# Patient Record
Sex: Female | Born: 1965 | Race: White | Hispanic: No | Marital: Single | Smoking: Current every day smoker
Health system: Southern US, Community
[De-identification: ages and names within clinical notes are randomized; demographics above are authoritative.]

## PROBLEM LIST (undated history)

## (undated) DIAGNOSIS — F419 Anxiety disorder, unspecified: Secondary | ICD-10-CM

## (undated) DIAGNOSIS — M1990 Unspecified osteoarthritis, unspecified site: Secondary | ICD-10-CM

## (undated) DIAGNOSIS — K219 Gastro-esophageal reflux disease without esophagitis: Secondary | ICD-10-CM

## (undated) DIAGNOSIS — J449 Chronic obstructive pulmonary disease, unspecified: Secondary | ICD-10-CM

## (undated) DIAGNOSIS — F329 Major depressive disorder, single episode, unspecified: Secondary | ICD-10-CM

## (undated) DIAGNOSIS — S2249XA Multiple fractures of ribs, unspecified side, initial encounter for closed fracture: Secondary | ICD-10-CM

## (undated) DIAGNOSIS — F32A Depression, unspecified: Secondary | ICD-10-CM

---

## 1996-11-26 HISTORY — PX: SPLENECTOMY: SUR1306

## 2005-01-11 ENCOUNTER — Emergency Department: Payer: Self-pay | Admitting: Emergency Medicine

## 2005-03-20 ENCOUNTER — Emergency Department: Payer: Self-pay | Admitting: Emergency Medicine

## 2005-05-15 ENCOUNTER — Emergency Department: Payer: Self-pay | Admitting: Internal Medicine

## 2006-09-20 ENCOUNTER — Emergency Department: Payer: Self-pay | Admitting: Emergency Medicine

## 2007-05-18 ENCOUNTER — Emergency Department: Payer: Self-pay

## 2007-06-01 ENCOUNTER — Emergency Department: Payer: Self-pay | Admitting: Emergency Medicine

## 2007-06-05 ENCOUNTER — Emergency Department: Payer: Self-pay | Admitting: Emergency Medicine

## 2007-06-30 ENCOUNTER — Emergency Department: Payer: Self-pay | Admitting: Emergency Medicine

## 2007-07-31 ENCOUNTER — Emergency Department: Payer: Self-pay | Admitting: Emergency Medicine

## 2007-09-29 ENCOUNTER — Ambulatory Visit: Payer: Self-pay | Admitting: Unknown Physician Specialty

## 2007-10-06 ENCOUNTER — Encounter: Payer: Self-pay | Admitting: Unknown Physician Specialty

## 2007-10-27 ENCOUNTER — Encounter: Payer: Self-pay | Admitting: Unknown Physician Specialty

## 2007-11-27 ENCOUNTER — Encounter: Payer: Self-pay | Admitting: Unknown Physician Specialty

## 2008-03-20 ENCOUNTER — Emergency Department: Payer: Self-pay | Admitting: Emergency Medicine

## 2008-07-08 ENCOUNTER — Other Ambulatory Visit: Payer: Self-pay

## 2008-07-08 ENCOUNTER — Inpatient Hospital Stay: Payer: Self-pay | Admitting: Psychiatry

## 2008-07-14 ENCOUNTER — Other Ambulatory Visit: Payer: Self-pay

## 2008-07-14 ENCOUNTER — Emergency Department: Payer: Self-pay | Admitting: Emergency Medicine

## 2008-11-26 HISTORY — PX: FRACTURE SURGERY: SHX138

## 2009-04-20 ENCOUNTER — Emergency Department: Payer: Self-pay | Admitting: Emergency Medicine

## 2009-06-09 ENCOUNTER — Ambulatory Visit: Payer: Self-pay | Admitting: Surgery

## 2009-06-10 ENCOUNTER — Ambulatory Visit: Payer: Self-pay | Admitting: Surgery

## 2009-06-14 ENCOUNTER — Emergency Department: Payer: Self-pay | Admitting: Emergency Medicine

## 2009-11-26 HISTORY — PX: HERNIA REPAIR: SHX51

## 2010-01-01 ENCOUNTER — Emergency Department: Payer: Self-pay | Admitting: Emergency Medicine

## 2010-01-06 ENCOUNTER — Ambulatory Visit: Payer: Self-pay | Admitting: General Practice

## 2010-01-09 ENCOUNTER — Ambulatory Visit: Payer: Self-pay | Admitting: General Practice

## 2010-10-01 ENCOUNTER — Emergency Department: Payer: Self-pay | Admitting: Unknown Physician Specialty

## 2010-10-06 ENCOUNTER — Ambulatory Visit: Payer: Self-pay | Admitting: Orthopedic Surgery

## 2011-01-15 ENCOUNTER — Other Ambulatory Visit (HOSPITAL_COMMUNITY): Payer: Medicaid Other

## 2011-01-17 ENCOUNTER — Other Ambulatory Visit (HOSPITAL_COMMUNITY): Payer: Medicaid Other

## 2011-01-18 ENCOUNTER — Encounter (HOSPITAL_COMMUNITY)
Admission: RE | Admit: 2011-01-18 | Discharge: 2011-01-18 | Disposition: A | Discharge status: Home / Self Care | Payer: Medicaid Other | Source: Ambulatory Visit | Attending: Orthopedic Surgery | Admitting: Orthopedic Surgery

## 2011-01-18 DIAGNOSIS — Z01812 Encounter for preprocedural laboratory examination: Secondary | ICD-10-CM | POA: Insufficient documentation

## 2011-01-18 DIAGNOSIS — Z01811 Encounter for preprocedural respiratory examination: Secondary | ICD-10-CM | POA: Insufficient documentation

## 2011-01-18 LAB — BASIC METABOLIC PANEL
Calcium: 9.9 mg/dL (ref 8.4–10.5)
GFR calc Af Amer: >60 mL/min (ref >60)
GFR calc non Af Amer: >60 mL/min (ref >60)
Potassium: 4.6 mEq/L (ref 3.5–5.1)
Sodium: 139 mEq/L (ref 135–145)

## 2011-01-18 LAB — DIFFERENTIAL
Basophils Absolute: 0.1 10*3/uL (ref 0.0–0.1)
Basophils Relative: 1 % (ref 0–1)
Lymphocytes Relative: 50 % — ABNORMAL HIGH (ref 12–46)
Monocytes Relative: 14 % — ABNORMAL HIGH (ref 3–12)
Neutro Abs: 3 10*3/uL (ref 1.7–7.7)
Neutrophils Relative %: 33 % — ABNORMAL LOW (ref 43–77)

## 2011-01-18 LAB — URINE MICROSCOPIC-ADD ON

## 2011-01-18 LAB — PROTIME-INR
INR: 0.8 (ref 0.00–1.49)
Prothrombin Time: 11.3 seconds — ABNORMAL LOW (ref 11.6–15.2)

## 2011-01-18 LAB — URINALYSIS, ROUTINE W REFLEX MICROSCOPIC
Nitrite: POSITIVE — AB
Specific Gravity, Urine: 1.016 (ref 1.005–1.030)
Urobilinogen, UA: 1 mg/dL (ref 0.0–1.0)

## 2011-01-18 LAB — CBC
HCT: 42.2 % (ref 36.0–46.0)
Hemoglobin: 14.5 g/dL (ref 12.0–15.0)
RBC: 4.21 MIL/uL (ref 3.87–5.11)

## 2011-01-18 LAB — SURGICAL PCR SCREEN: MRSA, PCR: NEGATIVE

## 2011-01-19 ENCOUNTER — Ambulatory Visit (HOSPITAL_COMMUNITY)
Admission: RE | Admit: 2011-01-19 | Discharge: 2011-01-20 | Disposition: A | Discharge status: Home / Self Care | Payer: Medicaid Other | Source: Ambulatory Visit | Attending: Orthopedic Surgery | Admitting: Orthopedic Surgery

## 2011-01-19 DIAGNOSIS — S42033A Displaced fracture of lateral end of unspecified clavicle, initial encounter for closed fracture: Secondary | ICD-10-CM | POA: Insufficient documentation

## 2011-01-19 DIAGNOSIS — F17200 Nicotine dependence, unspecified, uncomplicated: Secondary | ICD-10-CM | POA: Insufficient documentation

## 2011-01-20 LAB — CBC
HCT: 37.7 % (ref 36.0–46.0)
Hemoglobin: 12.5 g/dL (ref 12.0–15.0)
MCHC: 33.2 g/dL (ref 30.0–36.0)
MCV: 101.9 fL — ABNORMAL HIGH (ref 78.0–100.0)

## 2011-01-20 LAB — BASIC METABOLIC PANEL
CO2: 29 mEq/L (ref 19–32)
Calcium: 8.5 mg/dL (ref 8.4–10.5)
Glucose, Bld: 111 mg/dL — ABNORMAL HIGH (ref 70–99)
Sodium: 139 mEq/L (ref 135–145)
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
## 2011-02-06 ENCOUNTER — Inpatient Hospital Stay (HOSPITAL_COMMUNITY): Admission: RE | Admit: 2011-02-06 | Payer: Medicaid Other | Source: Ambulatory Visit | Admitting: Neurosurgery
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
## 2011-06-19 ENCOUNTER — Ambulatory Visit: Payer: Self-pay | Admitting: Internal Medicine

## 2011-11-06 NOTE — H&P (Signed)
45 y/o female with a previous coracoclaviclar reconstruction that failed. Pt presents with continued and worsening right shoulder pain interested in surgery to decrease her pain and increase rom of the right shoulder PMH: COPD, substance abuse Surgical: right shoulder CC reconstruction Allergies: NKDA Meds: spiriva, reclast, norco, calcium Social: smokes, positive for cocaine, positive for ETOH, Dr. Lacie Scotts for primary care PE: alert and appropriate 45 y/o female in no acute distress Cervical spine: full rom, cranial nerves 2-12 intact,  Right shoulder: moderate pain to [facility] area with mild deformity, nv intact distally, moderately decreased rom secondary to pain, strength limited to right shoulder as compared to left, no rashes or edema Assessment: failed coracoclavicular reconstruction Plan: admission for repair of right shoulder with allograft tissue and hardware removal

## 2011-11-07 ENCOUNTER — Encounter (HOSPITAL_COMMUNITY): Payer: Self-pay

## 2011-11-16 ENCOUNTER — Inpatient Hospital Stay (HOSPITAL_COMMUNITY): Admission: RE | Admit: 2011-11-16 | Discharge: 2011-11-16 | Payer: Medicaid Other | Source: Ambulatory Visit

## 2011-11-16 NOTE — Pre-Procedure Instructions (Signed)
20 Latoya [name]  11/16/2011   Your procedure is scheduled on: Friday 11/23/11   Report to [facility] at 1030 AM.  Call this number if you have problems the morning of surgery: [phone]   Remember:   Do not eat food:After Midnight.  May have clear liquids: up to 4 Hours before arrival.  Clear liquids include soda, tea, black coffee, apple or grape juice, broth.  Take these medicines the morning of surgery with A SIP OF WATER: INHALERS, XANAX, CYMBALTA, NEXIUM,    Do not wear jewelry, make-up or nail polish.  Do not wear lotions, powders, or perfumes. You may wear deodorant.  Do not shave 48 hours prior to surgery.  Do not bring valuables to the hospital.  Contacts, dentures or bridgework may not be worn into surgery.  Leave suitcase in the car. After surgery it may be brought to your room.  For patients admitted to the hospital, checkout time is 11:00 AM the day of discharge.   Patients discharged the day of surgery will not be allowed to drive home.  Name and phone number of your driver:   Special Instructions: CHG Shower Use Special Wash: 1/2 bottle night before surgery and 1/2 bottle morning of surgery.   Please read over the following fact sheets that you were given: Pain Booklet, MRSA Information and Surgical Site Infection Prevention

## 2011-11-19 ENCOUNTER — Inpatient Hospital Stay (HOSPITAL_COMMUNITY): Admission: RE | Admit: 2011-11-19 | Payer: Medicaid Other | Source: Ambulatory Visit

## 2011-11-22 ENCOUNTER — Inpatient Hospital Stay (HOSPITAL_COMMUNITY): Admission: RE | Admit: 2011-11-22 | Payer: Medicaid Other | Source: Ambulatory Visit

## 2011-11-27 ENCOUNTER — Inpatient Hospital Stay: Payer: Self-pay | Admitting: Surgery

## 2011-11-27 DIAGNOSIS — S2249XA Multiple fractures of ribs, unspecified side, initial encounter for closed fracture: Secondary | ICD-10-CM

## 2011-11-27 HISTORY — DX: Multiple fractures of ribs, unspecified side, initial encounter for closed fracture: S22.49XA

## 2011-11-27 LAB — COMPREHENSIVE METABOLIC PANEL WITH GFR
Albumin: 3.9 g/dL
Alkaline Phosphatase: 84 U/L
Anion Gap: 12
BUN: 6 mg/dL — ABNORMAL LOW
Bilirubin,Total: 0.4 mg/dL
Calcium, Total: 8.2 mg/dL — ABNORMAL LOW
Chloride: 95 mmol/L — ABNORMAL LOW
Co2: 28 mmol/L
Creatinine: 0.59 mg/dL — ABNORMAL LOW
EGFR (African American): >60
EGFR (Non-African Amer.): >60
Glucose: 90 mg/dL
Osmolality: 267
Potassium: 3.5 mmol/L
SGOT(AST): 75 U/L — ABNORMAL HIGH
SGPT (ALT): 47 U/L
Sodium: 135 mmol/L — ABNORMAL LOW
Total Protein: 7.3 g/dL

## 2011-11-27 LAB — CBC
HCT: 42.4 %
HGB: 14.1 g/dL
MCH: 34.2 pg — ABNORMAL HIGH
MCHC: 33.2 g/dL
MCV: 103 fL — ABNORMAL HIGH
Platelet: 302 x10 3/mm 3
RBC: 4.11 X10 6/mm 3
RDW: 12.4 %
WBC: 9.4 x10 3/mm 3

## 2011-11-27 LAB — URINALYSIS, COMPLETE
Bilirubin,UR: NEGATIVE
Glucose,UR: NEGATIVE mg/dL
Ketone: NEGATIVE
Nitrite: POSITIVE
Ph: 6
Protein: NEGATIVE
RBC,UR: 11 /HPF
Specific Gravity: 1.004
Squamous Epithelial: 1
WBC UR: 30 /HPF

## 2011-11-27 LAB — ETHANOL
Ethanol %: 0.241 % — ABNORMAL HIGH (ref 0.000–0.080)
Ethanol: 241 mg/dL

## 2011-11-28 LAB — COMPREHENSIVE METABOLIC PANEL
Albumin: 3.4 g/dL (ref 3.4–5.0)
BUN: 4 mg/dL — ABNORMAL LOW (ref 7–18)
Creatinine: 0.54 mg/dL — ABNORMAL LOW (ref 0.60–1.30)
Glucose: 108 mg/dL — ABNORMAL HIGH (ref 65–99)
Potassium: 4.4 mmol/L (ref 3.5–5.1)
Sodium: 139 mmol/L (ref 136–145)
Total Protein: 6.9 g/dL (ref 6.4–8.2)

## 2011-11-28 LAB — CBC WITH DIFFERENTIAL/PLATELET
HGB: 13.7 g/dL (ref 12.0–16.0)
MCH: 34.2 pg — ABNORMAL HIGH (ref 26.0–34.0)
MCV: 104 fL — ABNORMAL HIGH (ref 80–100)
Monocyte %: 13.8 %
Neutrophil #: 4.1 10*3/uL (ref 1.4–6.5)
RBC: 4 10*6/uL (ref 3.80–5.20)
WBC: 8.6 10*3/uL (ref 3.6–11.0)

## 2011-11-28 LAB — PROTIME-INR: Prothrombin Time: 12.2 secs (ref 11.5–14.7)

## 2011-11-28 LAB — AMYLASE: Amylase: 15 U/L — ABNORMAL LOW (ref 25–115)

## 2011-12-11 ENCOUNTER — Ambulatory Visit: Payer: Self-pay | Admitting: Surgery

## 2011-12-11 LAB — PREGNANCY, URINE: Pregnancy Test, Urine: NEGATIVE m[IU]/mL

## 2011-12-20 ENCOUNTER — Encounter (HOSPITAL_COMMUNITY): Admission: RE | Admit: 2011-12-20 | Payer: Medicaid Other | Source: Ambulatory Visit

## 2011-12-20 ENCOUNTER — Encounter (HOSPITAL_COMMUNITY): Payer: Self-pay

## 2011-12-20 ENCOUNTER — Encounter (HOSPITAL_COMMUNITY)
Admission: RE | Admit: 2011-12-20 | Discharge: 2011-12-20 | Disposition: A | Discharge status: Home / Self Care | Payer: Medicaid Other | Source: Ambulatory Visit | Attending: Orthopedic Surgery | Admitting: Orthopedic Surgery

## 2011-12-20 HISTORY — DX: Gastro-esophageal reflux disease without esophagitis: K21.9

## 2011-12-20 HISTORY — DX: Multiple fractures of ribs, unspecified side, initial encounter for closed fracture: S22.49XA

## 2011-12-20 HISTORY — DX: Depression, unspecified: F32.A

## 2011-12-20 HISTORY — DX: Unspecified osteoarthritis, unspecified site: M19.90

## 2011-12-20 HISTORY — DX: Anxiety disorder, unspecified: F41.9

## 2011-12-20 HISTORY — DX: Chronic obstructive pulmonary disease, unspecified: J44.9

## 2011-12-20 HISTORY — DX: Major depressive disorder, single episode, unspecified: F32.9

## 2011-12-20 LAB — CBC
HCT: 46.5 % — ABNORMAL HIGH (ref 36.0–46.0)
Hemoglobin: 15.4 g/dL — ABNORMAL HIGH (ref 12.0–15.0)
MCHC: 33.1 g/dL (ref 30.0–36.0)
RBC: 4.63 MIL/uL (ref 3.87–5.11)

## 2011-12-20 LAB — SURGICAL PCR SCREEN
MRSA, PCR: NEGATIVE
Staphylococcus aureus: NEGATIVE

## 2011-12-20 NOTE — Pre-Procedure Instructions (Signed)
20 Latoya [name]  12/20/2011   Your procedure is scheduled on:  Fri, Feb 1  Report to [facility] at 0530 AM.  Call this number if you have problems the morning of surgery: [phone]   Remember:   Do not eat food:After Midnight.  May have clear liquids: up to 4 Hours before arrival.  Clear liquids include soda, tea, black coffee, apple or grape juice, broth.  Take these medicines the morning of surgery with A SIP OF WATER: *inhalers, Spiriva,Nexium, Xanax,oxycodone**   Do not wear jewelry, make-up or nail polish.  Do not wear lotions, powders, or perfumes. You may wear deodorant.  Do not shave 48 hours prior to surgery.  Do not bring valuables to the hospital.  Contacts, dentures or bridgework may not be worn into surgery.  Leave suitcase in the car. After surgery it may be brought to your room.  For patients admitted to the hospital, checkout time is 11:00 AM the day of discharge.   Patients discharged the day of surgery will not be allowed to drive home.  Name and phone number of your driver: **na/*  Special Instructions: Incentive Spirometry - Practice and bring it with you on the day of surgery. CHG Shower the night before and the morning of surgery.   Please read over the following fact sheets that you were given: Pain Booklet, Coughing and Deep Breathing, MRSA Information and Surgical Site Infection Prevention
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
## 2011-12-27 MED ORDER — CEFAZOLIN SODIUM 1-5 GM-% IV SOLN
1.0000 g | INTRAVENOUS | Status: AC
  Administered 2011-12-28: 1 g via INTRAVENOUS

## 2011-12-27 MED ORDER — CHLORHEXIDINE GLUCONATE 4 % EX LIQD: 60.0000 mL | Freq: Once | CUTANEOUS | Status: DC

## 2011-12-27 MED ORDER — SODIUM CHLORIDE 0.9 % IV SOLN: INTRAVENOUS | Status: DC

## 2011-12-28 ENCOUNTER — Encounter (HOSPITAL_COMMUNITY)
Admission: RE | Disposition: A | Discharge status: Home / Self Care | Payer: Self-pay | Source: Ambulatory Visit | Attending: Orthopedic Surgery

## 2011-12-28 ENCOUNTER — Encounter (HOSPITAL_COMMUNITY): Payer: Self-pay | Admitting: *Deleted

## 2011-12-28 ENCOUNTER — Ambulatory Visit (HOSPITAL_COMMUNITY)
Admission: RE | Admit: 2011-12-28 | Discharge: 2011-12-29 | Disposition: A | Discharge status: Home / Self Care | Payer: Medicaid Other | Source: Ambulatory Visit | Attending: Orthopedic Surgery | Admitting: Orthopedic Surgery

## 2011-12-28 ENCOUNTER — Ambulatory Visit (HOSPITAL_COMMUNITY): Payer: Medicaid Other | Admitting: Vascular Surgery

## 2011-12-28 ENCOUNTER — Other Ambulatory Visit: Payer: Self-pay

## 2011-12-28 ENCOUNTER — Encounter (HOSPITAL_COMMUNITY): Payer: Self-pay | Admitting: Vascular Surgery

## 2011-12-28 DIAGNOSIS — Z01812 Encounter for preprocedural laboratory examination: Secondary | ICD-10-CM | POA: Insufficient documentation

## 2011-12-28 DIAGNOSIS — J4489 Other specified chronic obstructive pulmonary disease: Secondary | ICD-10-CM | POA: Insufficient documentation

## 2011-12-28 DIAGNOSIS — F17200 Nicotine dependence, unspecified, uncomplicated: Secondary | ICD-10-CM | POA: Insufficient documentation

## 2011-12-28 DIAGNOSIS — M25519 Pain in unspecified shoulder: Secondary | ICD-10-CM | POA: Diagnosis present

## 2011-12-28 DIAGNOSIS — Y831 Surgical operation with implant of artificial internal device as the cause of abnormal reaction of the patient, or of later complication, without mention of misadventure at the time of the procedure: Secondary | ICD-10-CM | POA: Insufficient documentation

## 2011-12-28 DIAGNOSIS — J449 Chronic obstructive pulmonary disease, unspecified: Secondary | ICD-10-CM | POA: Insufficient documentation

## 2011-12-28 DIAGNOSIS — T84498A Other mechanical complication of other internal orthopedic devices, implants and grafts, initial encounter: Secondary | ICD-10-CM | POA: Insufficient documentation

## 2011-12-28 DIAGNOSIS — K08109 Complete loss of teeth, unspecified cause, unspecified class: Secondary | ICD-10-CM | POA: Insufficient documentation

## 2011-12-28 HISTORY — PX: HARDWARE REMOVAL: SHX979

## 2011-12-28 SURGERY — REMOVAL, HARDWARE
Anesthesia: General | Site: Shoulder | Laterality: Right | Wound class: Clean

## 2011-12-28 MED ORDER — CEFAZOLIN SODIUM 1-5 GM-% IV SOLN
INTRAVENOUS | Status: AC
  Filled 2011-12-28: qty 50

## 2011-12-28 MED ORDER — BECLOMETHASONE DIPROPIONATE 80 MCG/ACT IN AERS
2.0000 | INHALATION_SPRAY | Freq: Every day | RESPIRATORY_TRACT | Status: DC
  Administered 2011-12-28 – 2011-12-29 (×2): 2 via RESPIRATORY_TRACT
  Filled 2011-12-28: qty 8.7

## 2011-12-28 MED ORDER — LACTATED RINGERS IV SOLN
INTRAVENOUS | Status: DC | PRN
  Administered 2011-12-28 (×2): via INTRAVENOUS

## 2011-12-28 MED ORDER — ACETAMINOPHEN 325 MG PO TABS
ORAL_TABLET | ORAL | Status: AC
  Filled 2011-12-28: qty 2

## 2011-12-28 MED ORDER — MIDAZOLAM HCL 2 MG/2ML IJ SOLN
1.0000 mg | INTRAMUSCULAR | Status: DC | PRN
  Administered 2011-12-28: 2 mg via INTRAVENOUS

## 2011-12-28 MED ORDER — HYDROMORPHONE HCL PF 1 MG/ML IJ SOLN
0.5000 mg | INTRAMUSCULAR | Status: DC | PRN
  Administered 2011-12-28 – 2011-12-29 (×5): 1 mg via INTRAVENOUS
  Filled 2011-12-28 (×6): qty 1

## 2011-12-28 MED ORDER — RISEDRONATE SODIUM 35 MG PO TBEC: 1.0000 | DELAYED_RELEASE_TABLET | ORAL | Status: DC

## 2011-12-28 MED ORDER — LACTATED RINGERS IV SOLN
INTRAVENOUS | Status: DC
  Administered 2011-12-28: 10:00:00 via INTRAVENOUS

## 2011-12-28 MED ORDER — METOCLOPRAMIDE HCL 10 MG PO TABS: 5.0000 mg | ORAL_TABLET | Freq: Three times a day (TID) | ORAL | Status: DC | PRN

## 2011-12-28 MED ORDER — HYDROMORPHONE HCL PF 1 MG/ML IJ SOLN
0.2500 mg | INTRAMUSCULAR | Status: DC | PRN
  Administered 2011-12-28: 0.25 mg via INTRAVENOUS

## 2011-12-28 MED ORDER — ONDANSETRON HCL 4 MG/2ML IJ SOLN: 4.0000 mg | Freq: Once | INTRAMUSCULAR | Status: DC | PRN

## 2011-12-28 MED ORDER — ONDANSETRON HCL 4 MG PO TABS: 4.0000 mg | ORAL_TABLET | Freq: Four times a day (QID) | ORAL | Status: DC | PRN

## 2011-12-28 MED ORDER — ARIPIPRAZOLE 5 MG PO TABS
5.0000 mg | ORAL_TABLET | Freq: Every day | ORAL | Status: DC
  Filled 2011-12-28 (×2): qty 1

## 2011-12-28 MED ORDER — PROPOFOL 10 MG/ML IV EMUL
INTRAVENOUS | Status: DC | PRN
  Administered 2011-12-28: 200 mg via INTRAVENOUS

## 2011-12-28 MED ORDER — ONDANSETRON HCL 4 MG/2ML IJ SOLN
INTRAMUSCULAR | Status: DC | PRN
  Administered 2011-12-28: 4 mg via INTRAVENOUS

## 2011-12-28 MED ORDER — HYDROMORPHONE HCL PF 1 MG/ML IJ SOLN
INTRAMUSCULAR | Status: AC
  Filled 2011-12-28: qty 1

## 2011-12-28 MED ORDER — FENTANYL CITRATE 0.05 MG/ML IJ SOLN
50.0000 ug | INTRAMUSCULAR | Status: DC | PRN
  Administered 2011-12-28: 100 ug via INTRAVENOUS

## 2011-12-28 MED ORDER — NEOSTIGMINE METHYLSULFATE 1 MG/ML IJ SOLN
INTRAMUSCULAR | Status: DC | PRN
  Administered 2011-12-28: 3 mg via INTRAVENOUS

## 2011-12-28 MED ORDER — ACETAMINOPHEN 325 MG PO TABS
650.0000 mg | ORAL_TABLET | Freq: Four times a day (QID) | ORAL | Status: DC | PRN
  Administered 2011-12-28: 650 mg via ORAL

## 2011-12-28 MED ORDER — MORPHINE SULFATE 2 MG/ML IJ SOLN: 0.0500 mg/kg | INTRAMUSCULAR | Status: DC | PRN

## 2011-12-28 MED ORDER — VITAMIN D 50 MCG (2000 UT) PO CAPS: 1.0000 | ORAL_CAPSULE | Freq: Every day | ORAL | Status: DC

## 2011-12-28 MED ORDER — CALCIUM 600-200 MG-UNIT PO TABS: 1.0000 | ORAL_TABLET | Freq: Two times a day (BID) | ORAL | Status: DC

## 2011-12-28 MED ORDER — ALPRAZOLAM 0.5 MG PO TABS
2.0000 mg | ORAL_TABLET | Freq: Three times a day (TID) | ORAL | Status: DC | PRN
  Administered 2011-12-28 – 2011-12-29 (×3): 2 mg via ORAL
  Filled 2011-12-28 (×3): qty 4

## 2011-12-28 MED ORDER — METOCLOPRAMIDE HCL 5 MG/ML IJ SOLN
5.0000 mg | Freq: Three times a day (TID) | INTRAMUSCULAR | Status: DC | PRN
  Filled 2011-12-28: qty 2

## 2011-12-28 MED ORDER — GLYCOPYRROLATE 0.2 MG/ML IJ SOLN
INTRAMUSCULAR | Status: DC | PRN
  Administered 2011-12-28: .4 mg via INTRAVENOUS

## 2011-12-28 MED ORDER — VITAMIN D3 25 MCG (1000 UNIT) PO TABS
2000.0000 [IU] | ORAL_TABLET | Freq: Every day | ORAL | Status: DC
  Filled 2011-12-28 (×2): qty 2

## 2011-12-28 MED ORDER — BUPIVACAINE-EPINEPHRINE PF 0.5-1:200000 % IJ SOLN
INTRAMUSCULAR | Status: DC | PRN
  Administered 2011-12-28: 25 mL

## 2011-12-28 MED ORDER — MEPERIDINE HCL 25 MG/ML IJ SOLN: 6.2500 mg | INTRAMUSCULAR | Status: DC | PRN

## 2011-12-28 MED ORDER — NICOTINE 7 MG/24HR TD PT24
7.0000 mg | MEDICATED_PATCH | TRANSDERMAL | Status: DC
Start: 2011-12-28 — End: 2011-12-29
  Administered 2011-12-28: 7 mg via TRANSDERMAL
  Filled 2011-12-28: qty 1

## 2011-12-28 MED ORDER — MIDAZOLAM HCL 5 MG/5ML IJ SOLN
INTRAMUSCULAR | Status: DC | PRN
  Administered 2011-12-28 (×2): 1 mg via INTRAVENOUS

## 2011-12-28 MED ORDER — ROCURONIUM BROMIDE 100 MG/10ML IV SOLN
INTRAVENOUS | Status: DC | PRN
  Administered 2011-12-28: 35 mg via INTRAVENOUS

## 2011-12-28 MED ORDER — BUPIVACAINE-EPINEPHRINE PF 0.25-1:200000 % IJ SOLN
INTRAMUSCULAR | Status: DC | PRN
  Administered 2011-12-28: 5 mL

## 2011-12-28 MED ORDER — OXYCODONE-ACETAMINOPHEN 5-325 MG PO TABS
1.0000 | ORAL_TABLET | ORAL | Status: DC | PRN
  Administered 2011-12-28 – 2011-12-29 (×4): 2 via ORAL
  Filled 2011-12-28 (×4): qty 2

## 2011-12-28 MED ORDER — CEFAZOLIN SODIUM 1-5 GM-% IV SOLN
1.0000 g | Freq: Four times a day (QID) | INTRAVENOUS | Status: AC
  Administered 2011-12-28 – 2011-12-29 (×3): 1 g via INTRAVENOUS
  Filled 2011-12-28 (×3): qty 50

## 2011-12-28 MED ORDER — CALCIUM CARBONATE-VITAMIN D 500-200 MG-UNIT PO TABS
1.0000 | ORAL_TABLET | Freq: Two times a day (BID) | ORAL | Status: DC
  Administered 2011-12-28 – 2011-12-29 (×2): 1 via ORAL
  Filled 2011-12-28 (×4): qty 1

## 2011-12-28 MED ORDER — FENTANYL CITRATE 0.05 MG/ML IJ SOLN
INTRAMUSCULAR | Status: DC | PRN
  Administered 2011-12-28: 100 ug via INTRAVENOUS
  Administered 2011-12-28 (×2): 50 ug via INTRAVENOUS
  Administered 2011-12-28: 100 ug via INTRAVENOUS

## 2011-12-28 MED ORDER — METHOCARBAMOL 100 MG/ML IJ SOLN
500.0000 mg | Freq: Four times a day (QID) | INTRAVENOUS | Status: DC | PRN
  Filled 2011-12-28: qty 5

## 2011-12-28 MED ORDER — ALBUTEROL SULFATE HFA 108 (90 BASE) MCG/ACT IN AERS
2.0000 | INHALATION_SPRAY | Freq: Every day | RESPIRATORY_TRACT | Status: DC
  Administered 2011-12-29: 2 via RESPIRATORY_TRACT
  Filled 2011-12-28: qty 6.7

## 2011-12-28 MED ORDER — MENTHOL 3 MG MT LOZG: 1.0000 | LOZENGE | OROMUCOSAL | Status: DC | PRN

## 2011-12-28 MED ORDER — POTASSIUM CHLORIDE IN NACL 20-0.9 MEQ/L-% IV SOLN
INTRAVENOUS | Status: DC
  Administered 2011-12-29: 50 mL/h via INTRAVENOUS
  Filled 2011-12-28 (×2): qty 1000

## 2011-12-28 MED ORDER — ACETAMINOPHEN 650 MG RE SUPP: 650.0000 mg | Freq: Four times a day (QID) | RECTAL | Status: DC | PRN

## 2011-12-28 MED ORDER — ONDANSETRON HCL 4 MG/2ML IJ SOLN: 4.0000 mg | Freq: Four times a day (QID) | INTRAMUSCULAR | Status: DC | PRN

## 2011-12-28 MED ORDER — DULOXETINE HCL 60 MG PO CPEP
60.0000 mg | ORAL_CAPSULE | Freq: Every day | ORAL | Status: DC
  Filled 2011-12-28 (×2): qty 1

## 2011-12-28 MED ORDER — PANTOPRAZOLE SODIUM 40 MG PO TBEC: 40.0000 mg | DELAYED_RELEASE_TABLET | Freq: Every day | ORAL | Status: DC

## 2011-12-28 MED ORDER — MOMETASONE FURO-FORMOTEROL FUM 100-5 MCG/ACT IN AERO: 2.0000 | INHALATION_SPRAY | Freq: Two times a day (BID) | RESPIRATORY_TRACT | Status: DC

## 2011-12-28 MED ORDER — METHOCARBAMOL 500 MG PO TABS
500.0000 mg | ORAL_TABLET | Freq: Four times a day (QID) | ORAL | Status: DC | PRN
  Administered 2011-12-29: 500 mg via ORAL
  Filled 2011-12-28: qty 1

## 2011-12-28 MED ORDER — PHENOL 1.4 % MT LIQD
1.0000 | OROMUCOSAL | Status: DC | PRN
  Filled 2011-12-28: qty 177

## 2011-12-28 MED ORDER — FLUTICASONE-SALMETEROL 100-50 MCG/DOSE IN AEPB
1.0000 | INHALATION_SPRAY | Freq: Two times a day (BID) | RESPIRATORY_TRACT | Status: DC
  Filled 2011-12-28: qty 14

## 2011-12-28 MED ORDER — TIOTROPIUM BROMIDE MONOHYDRATE 18 MCG IN CAPS
18.0000 ug | ORAL_CAPSULE | Freq: Every day | RESPIRATORY_TRACT | Status: DC
  Administered 2011-12-29: 18 ug via RESPIRATORY_TRACT
  Filled 2011-12-28: qty 5

## 2011-12-28 SURGICAL SUPPLY — 47 items
BANDAGE ELASTIC 4 VELCRO ST LF (GAUZE/BANDAGES/DRESSINGS) IMPLANT
BANDAGE ELASTIC 6 VELCRO ST LF (GAUZE/BANDAGES/DRESSINGS) IMPLANT
BANDAGE ESMARK 6X9 LF (GAUZE/BANDAGES/DRESSINGS) IMPLANT
BANDAGE GAUZE ELAST BULKY 4 IN (GAUZE/BANDAGES/DRESSINGS) IMPLANT
BNDG COHESIVE 4X5 TAN STRL (GAUZE/BANDAGES/DRESSINGS) IMPLANT
BNDG ESMARK 6X9 LF (GAUZE/BANDAGES/DRESSINGS)
CLOTH BEACON ORANGE TIMEOUT ST (SAFETY) ×2 IMPLANT
COVER SURGICAL LIGHT HANDLE (MISCELLANEOUS) ×2 IMPLANT
CUFF TOURNIQUET SINGLE 18IN (TOURNIQUET CUFF) IMPLANT
CUFF TOURNIQUET SINGLE 24IN (TOURNIQUET CUFF) IMPLANT
CUFF TOURNIQUET SINGLE 34IN LL (TOURNIQUET CUFF) IMPLANT
CUFF TOURNIQUET SINGLE 44IN (TOURNIQUET CUFF) IMPLANT
DRAPE C-ARM 42X72 X-RAY (DRAPES) IMPLANT
DRAPE EXTREMITY T 121X128X90 (DRAPE) IMPLANT
DRAPE INCISE IOBAN 66X45 STRL (DRAPES) IMPLANT
DRAPE ORTHO SPLIT 77X108 STRL (DRAPES) ×2
DRAPE PROXIMA HALF (DRAPES) ×2 IMPLANT
DRAPE SURG ORHT 6 SPLT 77X108 (DRAPES) ×2 IMPLANT
DRSG EMULSION OIL 3X3 NADH (GAUZE/BANDAGES/DRESSINGS) ×2 IMPLANT
DRSG PAD ABDOMINAL 8X10 ST (GAUZE/BANDAGES/DRESSINGS) ×2 IMPLANT
ELECT REM PT RETURN 9FT ADLT (ELECTROSURGICAL) ×2
ELECTRODE REM PT RTRN 9FT ADLT (ELECTROSURGICAL) ×1 IMPLANT
GLOVE BIOGEL PI ORTHO PRO 7.5 (GLOVE) ×1
GLOVE BIOGEL PI ORTHO PRO SZ8 (GLOVE) ×1
GLOVE ORTHO TXT STRL SZ7.5 (GLOVE) ×2 IMPLANT
GLOVE PI ORTHO PRO STRL 7.5 (GLOVE) ×1 IMPLANT
GLOVE PI ORTHO PRO STRL SZ8 (GLOVE) ×1 IMPLANT
GLOVE SURG ORTHO 8.5 STRL (GLOVE) ×2 IMPLANT
GOWN STRL NON-REIN LRG LVL3 (GOWN DISPOSABLE) ×2 IMPLANT
KIT BASIN OR (CUSTOM PROCEDURE TRAY) ×2 IMPLANT
KIT ROOM TURNOVER OR (KITS) ×2 IMPLANT
MANIFOLD NEPTUNE II (INSTRUMENTS) ×2 IMPLANT
NS IRRIG 1000ML POUR BTL (IV SOLUTION) ×2 IMPLANT
PACK GENERAL/GYN (CUSTOM PROCEDURE TRAY) ×2 IMPLANT
PAD ARMBOARD 7.5X6 YLW CONV (MISCELLANEOUS) ×4 IMPLANT
PAD CAST 4YDX4 CTTN HI CHSV (CAST SUPPLIES) IMPLANT
PADDING CAST COTTON 4X4 STRL (CAST SUPPLIES)
SPONGE GAUZE 4X4 12PLY (GAUZE/BANDAGES/DRESSINGS) ×2 IMPLANT
STAPLER VISISTAT 35W (STAPLE) ×2 IMPLANT
STOCKINETTE IMPERVIOUS 9X36 MD (GAUZE/BANDAGES/DRESSINGS) IMPLANT
SUT MNCRL AB 4-0 PS2 18 (SUTURE) IMPLANT
SUT VIC AB 2-0 CT1 27 (SUTURE)
SUT VIC AB 2-0 CT1 TAPERPNT 27 (SUTURE) IMPLANT
TAPE CLOTH SURG 4X10 WHT LF (GAUZE/BANDAGES/DRESSINGS) ×2 IMPLANT
TOWEL OR 17X24 6PK STRL BLUE (TOWEL DISPOSABLE) ×2 IMPLANT
TOWEL OR 17X26 10 PK STRL BLUE (TOWEL DISPOSABLE) ×2 IMPLANT
WATER STERILE IRR 1000ML POUR (IV SOLUTION) ×2 IMPLANT

## 2011-12-28 NOTE — Interval H&P Note (Signed)
History and Physical Interval Note:  12/28/2011 9:52 AM  [name]  has presented today for surgery, with the diagnosis of right shoulder coraclavicular disruption (fracture)  The various methods of treatment have been discussed with the patient and family. After consideration of risks, benefits and other options for treatment, the patient has consented to  Procedure(s): HARDWARE REMOVAL as a surgical intervention .  The patients' history has been reviewed, patient examined, no change in status, stable for surgery.  I have reviewed the patients' chart and labs.  Questions were answered to the patient's satisfaction.     Latoya [name],STEVEN R

## 2011-12-28 NOTE — Anesthesia Postprocedure Evaluation (Signed)
  Anesthesia Post-op Note  Patient: Latoya [name]  Procedure(s) Performed:  HARDWARE REMOVAL - Right Shoulder Hardware Removal and Coraclavicular Repair and Graft  Patient Location: PACU  Anesthesia Type: GA combined with regional for post-op pain  Level of Consciousness: awake, alert  and oriented  Airway and Oxygen Therapy: Patient Spontanous Breathing and Patient connected to nasal cannula oxygen  Post-op Pain: mild  Post-op Assessment: Post-op Vital signs reviewed, Patient's Cardiovascular Status Stable, Respiratory Function Stable, Patent Airway, No signs of Nausea or vomiting and Pain level controlled  Post-op Vital Signs: Reviewed and stable  Complications: No apparent anesthesia complications

## 2011-12-28 NOTE — Anesthesia Procedure Notes (Addendum)
Anesthesia Regional Block:  Interscalene brachial plexus block  Pre-Anesthetic Checklist: ,, timeout performed, Correct Patient, Correct Site, Correct Laterality, Correct Procedure, Correct Position, site marked, Risks and benefits discussed,  Surgical consent,  Pre-op evaluation,  At surgeon's request and post-op pain management  Laterality: Left and Upper  Prep: chloraprep       Needles:  Injection technique: Single-shot  Needle Type: Echogenic Needle     Needle Length: 5cm 5 cm Needle Gauge: 22 and 22 G    Additional Needles:  Procedures: ultrasound guided Interscalene brachial plexus block Narrative:  Start time: 12/28/2011 9:55 AM End time: 12/28/2011 10:08 AM Injection made incrementally with aspirations every 5 mL.  Performed by: Personally  Anesthesiologist: Sheldon Silvan  Supraclavicular block Procedure Name: Intubation Date/Time: 12/28/2011 10:36 AM Performed by: Caryn Bee Pre-anesthesia Checklist: Patient identified, Emergency Drugs available, Suction available, Patient being monitored and Timeout performed Patient Re-evaluated:Patient Re-evaluated prior to inductionOxygen Delivery Method: Circle System Utilized Preoxygenation: Pre-oxygenation with 100% oxygen Intubation Type: IV induction Ventilation: Mask ventilation without difficulty Laryngoscope Size: Mac and 3 Grade View: Grade I Tube type: Oral Tube size: 7.0 mm Number of attempts: 1 Airway Equipment and Method: stylet Placement Confirmation: ETT inserted through vocal cords under direct vision,  breath sounds checked- equal and bilateral and positive ETCO2 Secured at: 21 cm Tube secured with: Tape Dental Injury: Teeth and Oropharynx as per pre-operative assessment

## 2011-12-28 NOTE — Transfer of Care (Signed)
Immediate Anesthesia Transfer of Care Note  Patient: Latoya [name]  Procedure(s) Performed:  HARDWARE REMOVAL - Right Shoulder Hardware Removal and Coraclavicular Repair and Graft  Patient Location: PACU  Anesthesia Type: General  Level of Consciousness: awake, alert  and oriented  Airway & Oxygen Therapy: Patient Spontanous Breathing and Patient connected to nasal cannula oxygen  Post-op Assessment: Report given to PACU RN and Post -op Vital signs reviewed and stable  Post vital signs: Reviewed and stable  Complications: No apparent anesthesia complications

## 2011-12-28 NOTE — Preoperative (Signed)
Beta Blockers   Reason not to administer Beta Blockers:Not Applicable 

## 2011-12-28 NOTE — Brief Op Note (Signed)
12/28/2011  12:50 PM  PATIENT:  Latoya [name]  46 y.o. female  PRE-OPERATIVE DIAGNOSIS:  right shoulder coraclavicular disruption (fracture), loose hardware  POST-OPERATIVE DIAGNOSIS:  right shoulder coraclavicular disruption (fracture), loose hardware, coracoid tip fracture  PROCEDURE:  Procedure(s): HARDWARE REMOVAL, reconstruction of the CA ligament, conjoined anterior shoulder sling, removal of coracoid tip, removal of clavicle bone spurs, repair deltotrapezial fascia  SURGEON:  Surgeon(s): Verlee Rossetti, MD  PHYSICIAN ASSISTANT:   ASSISTANTS: Thea Gist, PA-C   ANESTHESIA:   general  EBL:  Total I/O In: 1000 [I.V.:1000] Out: 50 [Blood:50]  BLOOD ADMINISTERED:none  DRAINS: none   LOCAL MEDICATIONS USED:  MARCAINE 10 CC  SPECIMEN:  No Specimen  DISPOSITION OF SPECIMEN:  N/A  COUNTS:  YES  TOURNIQUET:  * No tourniquets in log *  DICTATION: .Other Dictation: Dictation Number [id number]  PLAN OF CARE: Admit to inpatient   PATIENT DISPOSITION:  PACU - hemodynamically stable.   Delay start of Pharmacological VTE agent (>24hrs) due to surgical blood loss or risk of bleeding:  {YES/NO/NOT APPLICABLE:20182

## 2011-12-28 NOTE — Anesthesia Preprocedure Evaluation (Addendum)
Anesthesia Evaluation  Patient identified by MRN, date of birth, ID band Patient awake    Reviewed: Allergy & Precautions, H&P , NPO status , Patient's Chart, lab work & pertinent test results  Airway Mallampati: I TM Distance: >3 FB Neck ROM: Full    Dental  (+) Upper Dentures, Lower Dentures and Dental Advisory Given   Pulmonary COPD COPD inhaler, Current Smoker,  clear to auscultation        Cardiovascular Regular Normal    Neuro/Psych    GI/Hepatic   Endo/Other    Renal/GU      Musculoskeletal   Abdominal   Peds  Hematology   Anesthesia Other Findings   Reproductive/Obstetrics                          Anesthesia Physical Anesthesia Plan  ASA: III  Anesthesia Plan: General   Post-op Pain Management:    Induction: Intravenous  Airway Management Planned: Oral ETT  Additional Equipment:   Intra-op Plan:   Post-operative Plan: Extubation in OR  Informed Consent: I have reviewed the patients History and Physical, chart, labs and discussed the procedure including the risks, benefits and alternatives for the proposed anesthesia with the patient or authorized representative who has indicated his/her understanding and acceptance.   Dental advisory given  Plan Discussed with:   Anesthesia Plan Comments:         Anesthesia Quick Evaluation

## 2011-12-29 MED ORDER — OXYCODONE-ACETAMINOPHEN 5-325 MG PO TABS: 1.0000 | ORAL_TABLET | ORAL | Status: DC | PRN

## 2011-12-29 MED ORDER — METHOCARBAMOL 500 MG PO TABS: 500.0000 mg | ORAL_TABLET | Freq: Three times a day (TID) | ORAL | Status: AC | PRN

## 2011-12-29 MED ORDER — OXYCODONE HCL 5 MG PO TABS: 5.0000 mg | ORAL_TABLET | ORAL | Status: AC | PRN
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
## 2011-12-29 NOTE — Progress Notes (Signed)
Orthopedics Progress Note  Subjective: I hurt  Objective:  Filed Vitals:   12/29/11 0613  BP: 96/63  Pulse: 84  Temp: 97.6 F (36.4 C)  Resp: 20    General: Awake and alert  Musculoskeletal: Right shoulder wounds clean dry and intact.  Dressing changed NVI R arm Neurovascularly intact  Lab Results  Component Value Date   WBC 15.1* 12/20/2011   HGB 15.4* 12/20/2011   HCT 46.5* 12/20/2011   MCV 100.4* 12/20/2011   PLT 412* 12/20/2011       Component Value Date/Time   NA 139 01/20/2011 0430   K 3.8 01/20/2011 0430   CL 104 01/20/2011 0430   CO2 29 01/20/2011 0430   GLUCOSE 111* 01/20/2011 0430   BUN 2* 01/20/2011 0430   CREATININE 0.60 01/20/2011 0430   CALCIUM 8.5 01/20/2011 0430   GFRNONAA >60 01/20/2011 0430   GFRAA  Value: >60        The eGFR has been calculated using the MDRD equation. This calculation has not been validated in all clinical situations. eGFR's persistently <60 mL/min signify possible Chronic Kidney Disease. 01/20/2011 0430    Lab Results  Component Value Date   INR 0.80 01/18/2011    Assessment/Plan: POD #1 s/p Procedure(s): HARDWARE REMOVAL I reassured the patient that the pain will improve over the next 24 hours.  She wanted pain meds without tylenol, so I did a prescription for plain oxycodone.  She reported to me that she has been on Percocet since Feb of last year, so her pain today makes sense.  We did not have to re-do the C-C reconstruction as that had healed, and just sewed up the CA sling and the conjoined tendon removing the loose coracoid tip, so her pain should subside rather quickly.  D/C home.  Discharge instructions done and reviewed.  Almedia Balls. Ranell Patrick, MD 12/29/2011 8:22 AM

## 2011-12-29 NOTE — Op Note (Signed)
NAMEDHRITI, FALES NO.:  [id number]  MEDICAL RECORD NO.:  [id number]  LOCATION:  5005                         FACILITY:  [facility]  PHYSICIAN:  Almedia Balls. Ranell Patrick, M.D. 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  PROCEDURE PERFORMED:  Right shoulder hardware removal and removal of coracoid tip with advancement of conjoined tendon and reconstruction of CA ligament/sling and reinforcement of deltotrapezial fascia tissue.  ATTENDING SURGEON:  Almedia Balls. Ranell Patrick, MD.  ASSISTANT:  Modesto Charon, [facility] was scrubbed during the entire procedure and necessary for completion of procedure.  ANESTHESIA:  General anesthesia was used plus interscalene block.  ESTIMATED BLOOD LOSS:  Minimal.  FLUID REPLACEMENT:  1200 mL crystalloid.  INSTRUMENT COUNTS:  Correct.  There were no complications.  Perioperative antibiotics were given.  INDICATIONS:  The patient is a 46 year old female with a history of a right shoulder distal clavicle fracture and coracoclavicular ligament disruption.  The patient underwent ORIF of her distal clavicle with compressions,  lag screws and coracoclavicular fixation with an Arthrex TightRope.  Unfortunately postoperatively, the patient developed failure for hardware fixation with screw pull out, and also with her TightRope disruption.  The patient presents now for refractory shoulder pain, and we were concerned about those loose screws.  The patient wanted those screws taken out, and an attempted re-repair of the coracoclavicular ligaments if warranted.  Informed consent was obtained.  DESCRIPTION OF  PROCEDURE:  After adequate level of anesthesia was achieved, the patient is positioned in modified beachchair position. Right shoulder was exam under anesthesia.  The patient in full passive range of motion.  She did have some anterior instability in the shoulder, felt like her CA ligament might be insufficient, hence I can translate her humerus anteriorly.  It did not rest in that position, but I could definitely push her forward and I definitely felt like she had a loose lax capsule as well.  Her clavicle fortunately felt stable, as relative to the coracoid and the remaining tissue.  Interestingly, the coracoid felt a little bit soft.  After sterile prep and drape, a time- out was called.  We then entered the shoulder from 1st through the patient's dorsal incision which is longitudinal in line with its clavicle.  We were able to dissect down through the subcutaneous tissues using the knife and then the needle tip Bovie for the periosteal dissection on top of the clavicle.  We noted there to be a large suture knot and button which we took out using a sharp knife and a pick up.  We next found the 2 screws.  We had fluoroscope in with this and took those out, and I just left one Endobutton, which was an Arthrex TightRope button remaining deep to the clavicle, but superficial to the coracoid. I made a separate incision, basically it is a deltopectoral incision started at  the coracoid and extending down distally.  The patient had incision there as well.  We did use some Marcaine in the skin to assist with postop pain.  We identified the deltopectoral interval.  I was able to get deep to that and then immediately encountered an interesting finding that a coracoid tip was loose and was broken free from the remaining coracoid.  We verified that with the C-arm.  I then went ahead and shelled out her coracoid tip, that was completely loose and we felt like an attempt of an ORIF of that would not  be successful given her poor bone quality and her severe smoking history.  We then went ahead and did a soft tissue repair and reconstruction of her CA sling and repaired her conjoined tendon up to some fascia and some scar tissue there up, just proximal lateral to her coracoid base.  I was concerned that if we try to repair this directly the coracoid base, then we would potentially constrict her tether her muscular cutaneous nerve as well as her axillary nerve.  We were actually really pleased with how that turndown and tightened up her upper shoulder a little bit anteriorly. We were still able to internally and externally rotate independently with the subscap moving freely into the coracoid.  Next, we tried through both the anterior incision and the superior incision for at least 30 minutes to get that EndoButton out, and it just was caught up in some soft tissues, and we really did not want to completely dissect all that healed coracoclavicular ligament area, to try to get that and after about 30 minutes, trying with the C-arm being used,  we just decided to leave it.  So, we did not do any damage because we were at times medial to the coracoid process, obviously very near the brachial plexus.  We thoroughly irrigated all wounds, and then repaired the deltotrapezial fascia anatomically and reinforcing that distal clavicle fashion and soft tissue attachment, and felt like we had a nice stable distal clavicle and stable shoulder after the repair.  Subcu closure with 2-0 Vicryl and 4-0 Monocryl for skin.  Steri-Strips applied.  Sterile dressing.  The patient placed in a shoulder sling, taken to recovery room.     Almedia Balls. Ranell Patrick, M.D.     SRN/MEDQ  D:  12/28/2011  T:  12/29/2011  Job:  [id number]

## 2011-12-29 NOTE — Discharge Summary (Signed)
Physician Discharge Summary  Patient ID: Latoya [name] MRN: [id number] DOB/AGE: Dec 18, 1965 46 y.o.  Admit date: 12/28/2011 Discharge date: 12/29/2011  Admission Diagnoses:  Principal Problem:  *Shoulder pain   Discharge Diagnoses:  Same   Surgeries: Procedure(s): HARDWARE REMOVAL on 12/28/2011, reconstruction of CA sling.   Consultants: occupational therapy  Discharged Condition: Stable  Hospital Course: Latoya [name] is an 46 y.o. female who was admitted 12/28/2011 with a chief complaint of right shoulder pain, and found to have a diagnosis of right shoulder loose hardware and a healed C-C reconstruction.  They were brought to the operating room on 12/28/2011 and underwent the above named procedures.    The patient had an uncomplicated hospital course and was stable for discharge.  Recent vital signs:  Filed Vitals:   12/29/11 0613  BP: 96/63  Pulse: 84  Temp: 97.6 F (36.4 C)  Resp: 20    Recent laboratory studies:  Results for orders placed during the hospital encounter of 12/20/11  SURGICAL PCR SCREEN      Component Value Range   MRSA, PCR NEGATIVE  NEGATIVE    Staphylococcus aureus NEGATIVE  NEGATIVE   CBC      Component Value Range   WBC 15.1 (*) 4.0 - 10.5 (K/uL)   RBC 4.63  3.87 - 5.11 (MIL/uL)   Hemoglobin 15.4 (*) 12.0 - 15.0 (g/dL)   HCT 78.2 (*) 95.6 - 46.0 (%)   MCV 100.4 (*) 78.0 - 100.0 (fL)   MCH 33.3  26.0 - 34.0 (pg)   MCHC 33.1  30.0 - 36.0 (g/dL)   RDW 21.3  08.6 - 57.8 (%)   Platelets 412 (*) 150 - 400 (K/uL)    Discharge Medications:   Medication List  As of 12/29/2011  7:45 AM   ASK your doctor about these medications         albuterol 108 (90 BASE) MCG/ACT inhaler   Commonly known as: PROVENTIL HFA;VENTOLIN HFA   Inhale 2 puffs into the lungs daily.      alprazolam 2 MG tablet   Commonly known as: XANAX   Take 2 mg by mouth 3 (three) times daily as needed. For anxiety.      ARIPiprazole 5 MG tablet   Commonly known as: ABILIFY   Take 5 mg  by mouth daily.      ATELVIA 35 MG Tbec   Generic drug: Risedronate Sodium   Take 1 tablet by mouth once a week. Thursday.      beclomethasone 80 MCG/ACT inhaler   Commonly known as: QVAR   Inhale 2 puffs into the lungs daily.      CALCIUM PO   Take 1 capsule by mouth.      DULERA IN   Inhale 2 puffs into the lungs every 12 (twelve) hours.      DULoxetine 60 MG capsule   Commonly known as: CYMBALTA   Take 60 mg by mouth daily.      esomeprazole 40 MG capsule   Commonly known as: NEXIUM   Take 40 mg by mouth daily before breakfast.      tiotropium 18 MCG inhalation capsule   Commonly known as: SPIRIVA   Place 18 mcg into inhaler and inhale daily.      Vitamin D 2000 UNITS Caps   Take 1 capsule by mouth daily.            Diagnostic Studies: No results found.  Disposition: Home or Self Care    Follow-up  Information    Follow up with Allyana Vogan,STEVEN R, MD. Schedule an appointment as soon as possible for a visit in 2 weeks. [phone])    Contact information:   [facility] [address], [address] [place] [address] [phone]           Signed: Verlee Rossetti 12/29/2011, 7:45 AM

## 2011-12-31 ENCOUNTER — Encounter (HOSPITAL_COMMUNITY): Payer: Self-pay | Admitting: Orthopedic Surgery

## 2012-07-21 ENCOUNTER — Emergency Department: Payer: Self-pay | Admitting: Emergency Medicine

## 2012-07-22 ENCOUNTER — Emergency Department: Payer: Self-pay | Admitting: Emergency Medicine

## 2012-12-30 IMAGING — CR DG CHEST 2V
1 series · 2 of 2 positions shown · non-contrast
Comparison: none

REASON FOR EXAM: COMMENTS:

[Series 1: pa · 0.17mm/px · 2 of 2 slices shown]
[im 1/2]
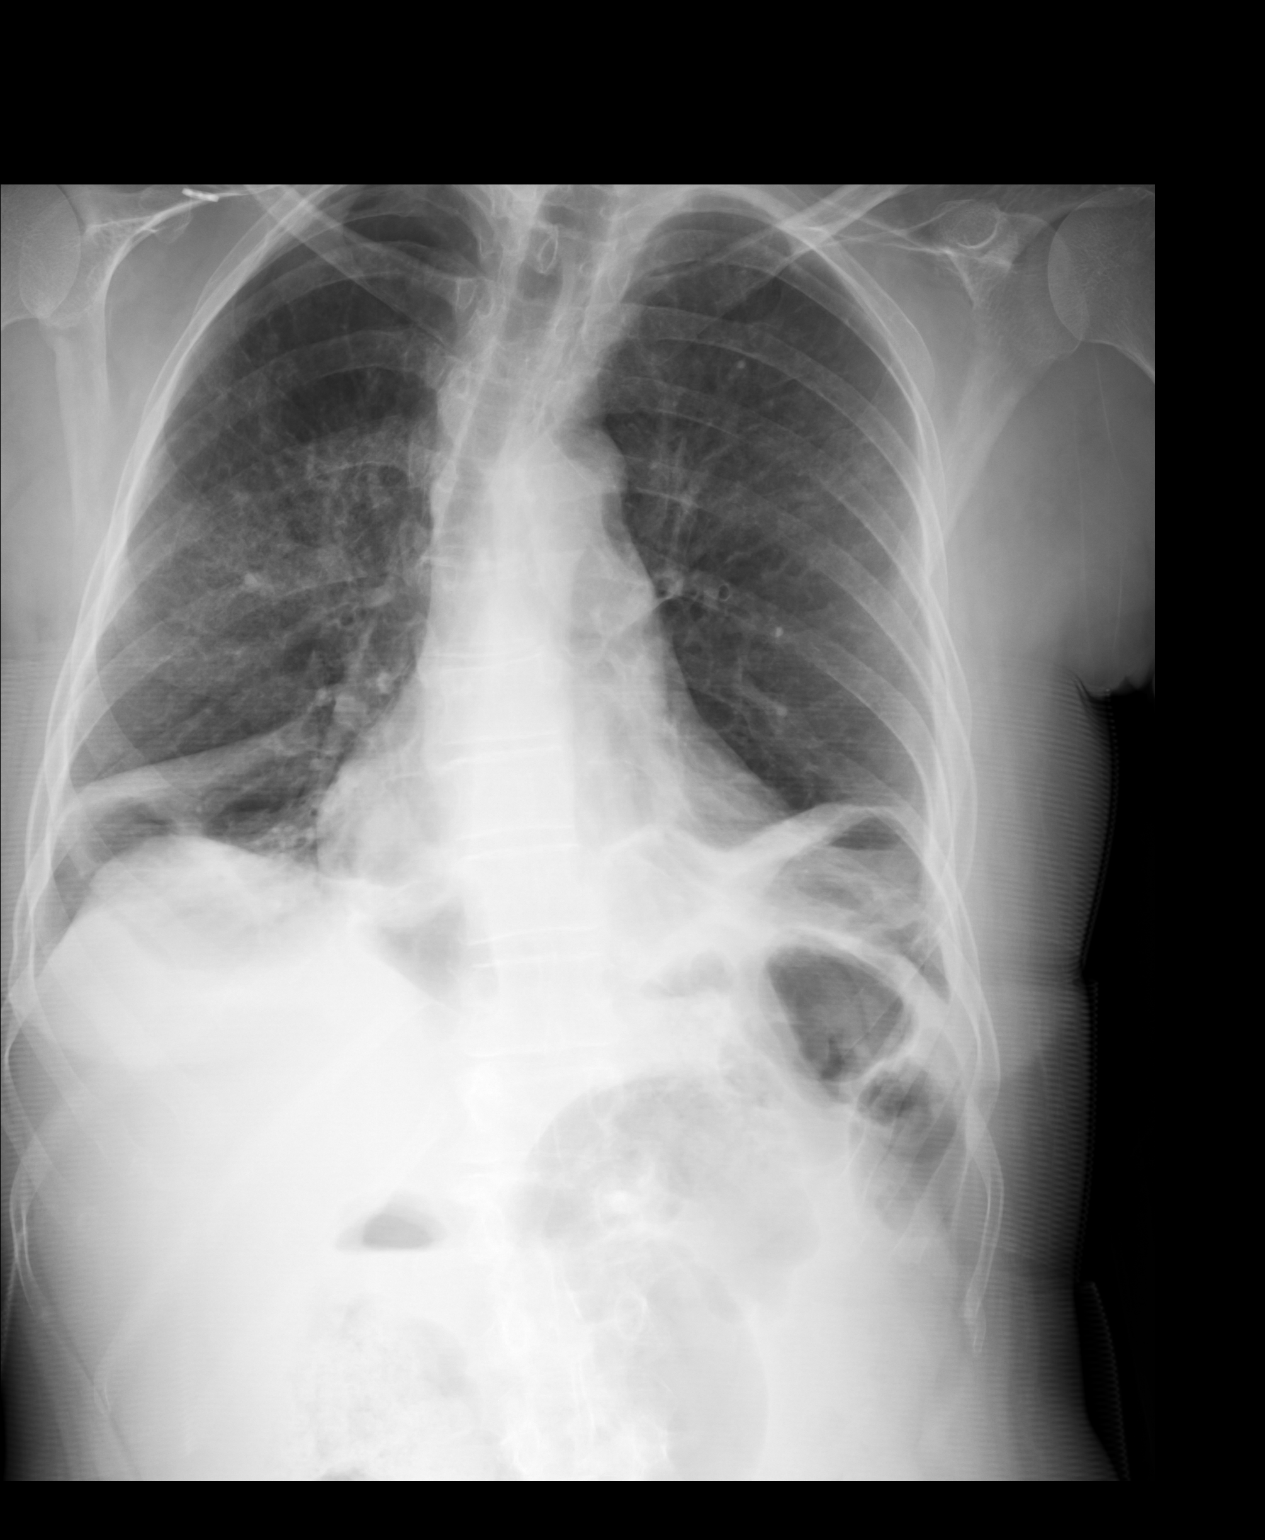
[im 2/2]
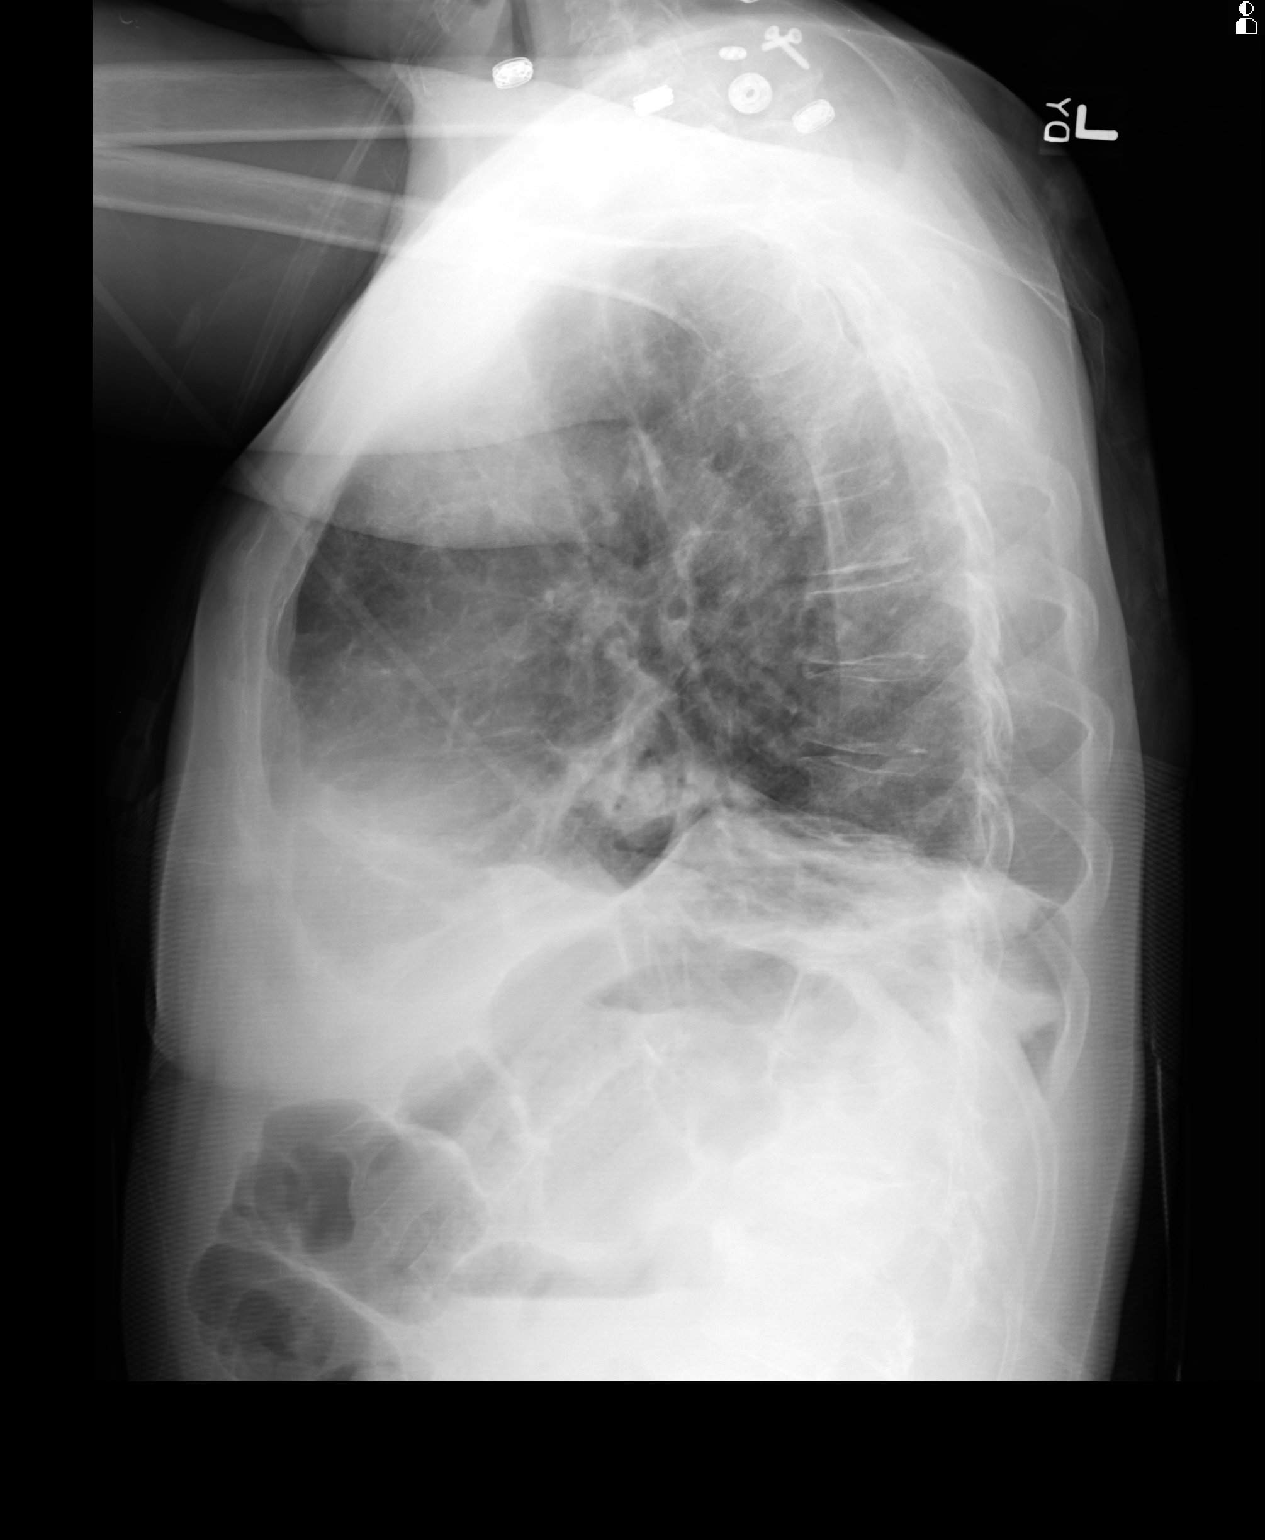

[2 of 2 positions shown; findings below may reference images not displayed]

PROCEDURE:     DXR - DXR CHEST PA (OR AP) AND LATERAL  - December 02, 2011  [DATE]

RESULT:      Comparison is made to the exam dated 01 December, 2011.

There is increased density at both lung bases suggestive of atelectasis
versus infiltrate without significant effusion. The other portions of the
lungs appear to show grossly normal aeration. The bony structures appear
intact.
IMPRESSION: Bilateral lung base areas of atelectasis versus pneumonia.

Addendum: The previously demonstrated left sixth rib fracture is not
definitely identified on today's exam.

## 2013-02-11 LAB — COMPREHENSIVE METABOLIC PANEL
Calcium, Total: 7.7 mg/dL — ABNORMAL LOW (ref 8.5–10.1)
Co2: 44 mmol/L (ref 21–32)
Creatinine: 0.48 mg/dL — ABNORMAL LOW (ref 0.60–1.30)
Osmolality: 297 (ref 275–301)
Sodium: 150 mmol/L — ABNORMAL HIGH (ref 136–145)
Total Protein: 6.6 g/dL (ref 6.4–8.2)

## 2013-02-11 LAB — ETHANOL
Ethanol %: 0.073 % (ref 0.000–0.080)
Ethanol: 73 mg/dL

## 2013-02-11 LAB — DRUG SCREEN, URINE
Amphetamines, Ur Screen: NEGATIVE (ref ?–1000)
Cannabinoid 50 Ng, Ur ~~LOC~~: NEGATIVE (ref ?–50)
MDMA (Ecstasy)Ur Screen: NEGATIVE (ref ?–500)
Methadone, Ur Screen: NEGATIVE (ref ?–300)

## 2013-02-11 LAB — URINALYSIS, COMPLETE
Glucose,UR: NEGATIVE mg/dL (ref 0–75)
Nitrite: POSITIVE
Protein: NEGATIVE
RBC,UR: 1 /HPF (ref 0–5)
Squamous Epithelial: 1
WBC UR: 3 /HPF (ref 0–5)

## 2013-02-11 LAB — CBC
HCT: 38.1 % (ref 35.0–47.0)
MCH: 33 pg (ref 26.0–34.0)
Platelet: 321 10*3/uL (ref 150–440)
RBC: 3.9 10*6/uL (ref 3.80–5.20)
RDW: 12.9 % (ref 11.5–14.5)
WBC: 12.9 10*3/uL — ABNORMAL HIGH (ref 3.6–11.0)

## 2013-02-11 LAB — TSH: Thyroid Stimulating Horm: 0.49 u[IU]/mL

## 2013-02-12 ENCOUNTER — Inpatient Hospital Stay: Payer: Self-pay | Admitting: Internal Medicine

## 2013-02-12 LAB — SALICYLATE LEVEL: Salicylates, Serum: 4.5 mg/dL — ABNORMAL HIGH

## 2013-02-12 LAB — BASIC METABOLIC PANEL
Calcium, Total: 7.1 mg/dL — ABNORMAL LOW (ref 8.5–10.1)
Chloride: 113 mmol/L — ABNORMAL HIGH (ref 98–107)
EGFR (Non-African Amer.): >60
Glucose: 115 mg/dL — ABNORMAL HIGH (ref 65–99)
Osmolality: 288 (ref 275–301)
Potassium: 3.3 mmol/L — ABNORMAL LOW (ref 3.5–5.1)

## 2013-02-12 LAB — MAGNESIUM: Magnesium: 1.9 mg/dL

## 2013-02-12 LAB — ACETAMINOPHEN LEVEL: Acetaminophen: 3 ug/mL — ABNORMAL LOW

## 2013-02-12 LAB — CK TOTAL AND CKMB (NOT AT ARMC): CK-MB: <0.5 ng/mL — ABNORMAL LOW (ref 0.5–3.6)

## 2013-02-13 LAB — BASIC METABOLIC PANEL
Anion Gap: 7 (ref 7–16)
BUN: 5 mg/dL — ABNORMAL LOW (ref 7–18)
Calcium, Total: 8 mg/dL — ABNORMAL LOW (ref 8.5–10.1)
Chloride: 111 mmol/L — ABNORMAL HIGH (ref 98–107)
Co2: 23 mmol/L (ref 21–32)
Creatinine: 0.52 mg/dL — ABNORMAL LOW (ref 0.60–1.30)
EGFR (African American): >60
EGFR (Non-African Amer.): >60
Glucose: 136 mg/dL — ABNORMAL HIGH (ref 65–99)
Osmolality: 281 (ref 275–301)
Potassium: 3.6 mmol/L (ref 3.5–5.1)
Sodium: 141 mmol/L (ref 136–145)

## 2013-02-13 LAB — CBC WITH DIFFERENTIAL/PLATELET
Basophil #: 0.1 10*3/uL (ref 0.0–0.1)
Basophil %: 0.6 %
Eosinophil #: 0.7 10*3/uL (ref 0.0–0.7)
Eosinophil %: 5.1 %
HCT: 39.2 % (ref 35.0–47.0)
HGB: 13.6 g/dL (ref 12.0–16.0)
Lymphocyte #: 2.8 10*3/uL (ref 1.0–3.6)
Lymphocyte %: 19.8 %
MCH: 34.7 pg — ABNORMAL HIGH (ref 26.0–34.0)
MCHC: 34.6 g/dL (ref 32.0–36.0)
MCV: 100 fL (ref 80–100)
Monocyte #: 1.2 x10 3/mm — ABNORMAL HIGH (ref 0.2–0.9)
Monocyte %: 8.3 %
Neutrophil #: 9.5 10*3/uL — ABNORMAL HIGH (ref 1.4–6.5)
Neutrophil %: 66.2 %
Platelet: 326 10*3/uL (ref 150–440)
RBC: 3.91 10*6/uL (ref 3.80–5.20)
RDW: 12.6 % (ref 11.5–14.5)
WBC: 14.3 10*3/uL — ABNORMAL HIGH (ref 3.6–11.0)

## 2013-02-13 LAB — URINALYSIS, COMPLETE
Glucose,UR: NEGATIVE mg/dL (ref 0–75)
Nitrite: NEGATIVE
Protein: NEGATIVE
Squamous Epithelial: NONE SEEN
WBC UR: 4 /HPF (ref 0–5)

## 2013-02-13 LAB — CK TOTAL AND CKMB (NOT AT ARMC)
CK, Total: 33 U/L (ref 21–215)
CK, Total: 48 U/L (ref 21–215)
CK-MB: <0.5 ng/mL — ABNORMAL LOW (ref 0.5–3.6)

## 2013-02-13 LAB — TROPONIN I
Troponin-I: <0.02 ng/mL
Troponin-I: <0.02 ng/mL

## 2013-02-13 LAB — MAGNESIUM: Magnesium: 2.1 mg/dL

## 2013-02-14 LAB — CBC WITH DIFFERENTIAL/PLATELET
Basophil #: 0.2 10*3/uL — ABNORMAL HIGH (ref 0.0–0.1)
HGB: 14.1 g/dL (ref 12.0–16.0)
Lymphocyte #: 3.6 10*3/uL (ref 1.0–3.6)
Lymphocyte %: 30.2 %
MCHC: 33.9 g/dL (ref 32.0–36.0)
Monocyte %: 10.2 %
Neutrophil #: 6.8 10*3/uL — ABNORMAL HIGH (ref 1.4–6.5)
Neutrophil %: 56.9 %
Platelet: 329 10*3/uL (ref 150–440)
RBC: 4.17 10*6/uL (ref 3.80–5.20)
RDW: 12.7 % (ref 11.5–14.5)
WBC: 12 10*3/uL — ABNORMAL HIGH (ref 3.6–11.0)

## 2013-02-14 LAB — BASIC METABOLIC PANEL
Anion Gap: 8 (ref 7–16)
BUN: 4 mg/dL — ABNORMAL LOW (ref 7–18)
Calcium, Total: 8.8 mg/dL (ref 8.5–10.1)
Osmolality: 281 (ref 275–301)
Potassium: 3 mmol/L — ABNORMAL LOW (ref 3.5–5.1)

## 2013-02-15 LAB — POTASSIUM: Potassium: 3.3 mmol/L — ABNORMAL LOW (ref 3.5–5.1)

## 2013-02-17 LAB — BASIC METABOLIC PANEL
Anion Gap: 5 — ABNORMAL LOW (ref 7–16)
Calcium, Total: 9 mg/dL (ref 8.5–10.1)
Chloride: 108 mmol/L — ABNORMAL HIGH (ref 98–107)
Creatinine: 0.58 mg/dL — ABNORMAL LOW (ref 0.60–1.30)
EGFR (Non-African Amer.): >60
Glucose: 116 mg/dL — ABNORMAL HIGH (ref 65–99)
Potassium: 3.6 mmol/L (ref 3.5–5.1)
Sodium: 139 mmol/L (ref 136–145)

## 2013-02-17 LAB — HEPATIC FUNCTION PANEL A (ARMC)
Albumin: 3 g/dL — ABNORMAL LOW (ref 3.4–5.0)
Alkaline Phosphatase: 109 U/L (ref 50–136)
Bilirubin, Direct: 0.3 mg/dL — ABNORMAL HIGH (ref 0.00–0.20)
SGOT(AST): 24 U/L (ref 15–37)

## 2013-02-18 ENCOUNTER — Inpatient Hospital Stay: Payer: Self-pay | Admitting: Psychiatry

## 2013-02-20 LAB — CBC WITH DIFFERENTIAL/PLATELET
Basophil #: 0.1 10*3/uL (ref 0.0–0.1)
Basophil %: 0.8 %
Eosinophil #: 0.1 10*3/uL (ref 0.0–0.7)
HCT: 39.2 % (ref 35.0–47.0)
Lymphocyte #: 3.7 10*3/uL — ABNORMAL HIGH (ref 1.0–3.6)
MCH: 33.3 pg (ref 26.0–34.0)
MCHC: 34.2 g/dL (ref 32.0–36.0)
MCV: 97 fL (ref 80–100)
Monocyte %: 14.1 %
Neutrophil #: 4.9 10*3/uL (ref 1.4–6.5)
Platelet: 357 10*3/uL (ref 150–440)

## 2013-02-20 LAB — BASIC METABOLIC PANEL
Anion Gap: 6 — ABNORMAL LOW (ref 7–16)
BUN: 7 mg/dL (ref 7–18)
Calcium, Total: 8.8 mg/dL (ref 8.5–10.1)
Creatinine: 0.58 mg/dL — ABNORMAL LOW (ref 0.60–1.30)
EGFR (African American): >60
EGFR (Non-African Amer.): >60
Osmolality: 271 (ref 275–301)
Sodium: 137 mmol/L (ref 136–145)

## 2013-05-11 ENCOUNTER — Emergency Department: Payer: Self-pay | Admitting: Emergency Medicine

## 2013-05-23 ENCOUNTER — Emergency Department: Payer: Self-pay | Admitting: Emergency Medicine

## 2013-05-23 LAB — URINALYSIS, COMPLETE
Ketone: NEGATIVE
Ph: 6 (ref 4.5–8.0)
Protein: NEGATIVE
Specific Gravity: 1.019 (ref 1.003–1.030)
Squamous Epithelial: 1
WBC UR: 1 /HPF (ref 0–5)

## 2013-05-23 LAB — COMPREHENSIVE METABOLIC PANEL
Creatinine: 0.81 mg/dL (ref 0.60–1.30)
EGFR (African American): >60
SGOT(AST): 30 U/L (ref 15–37)
SGPT (ALT): 28 U/L (ref 12–78)
Sodium: 143 mmol/L (ref 136–145)
Total Protein: 7 g/dL (ref 6.4–8.2)

## 2013-05-23 LAB — CBC
MCV: 97 fL (ref 80–100)
WBC: 10.1 10*3/uL (ref 3.6–11.0)

## 2013-07-27 DEATH — deceased
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
## 2014-03-12 IMAGING — CR DG CHEST 1V PORT
1 series · 1 of 1 positions shown · non-contrast
Comparison: none

REASON FOR EXAM: eval ETT placement
COMMENTS:

PROCEDURE:     DXR - DXR PORTABLE CHEST SINGLE VIEW  - February 11, 2013 [DATE]
RESULT:     Endotracheal tube in good position. NG tube noted with its tip
in the upper chest most likely upper esophagus. Repositioning should be
considered. Atelectasis left lung base. Heart size normal.

[ap]
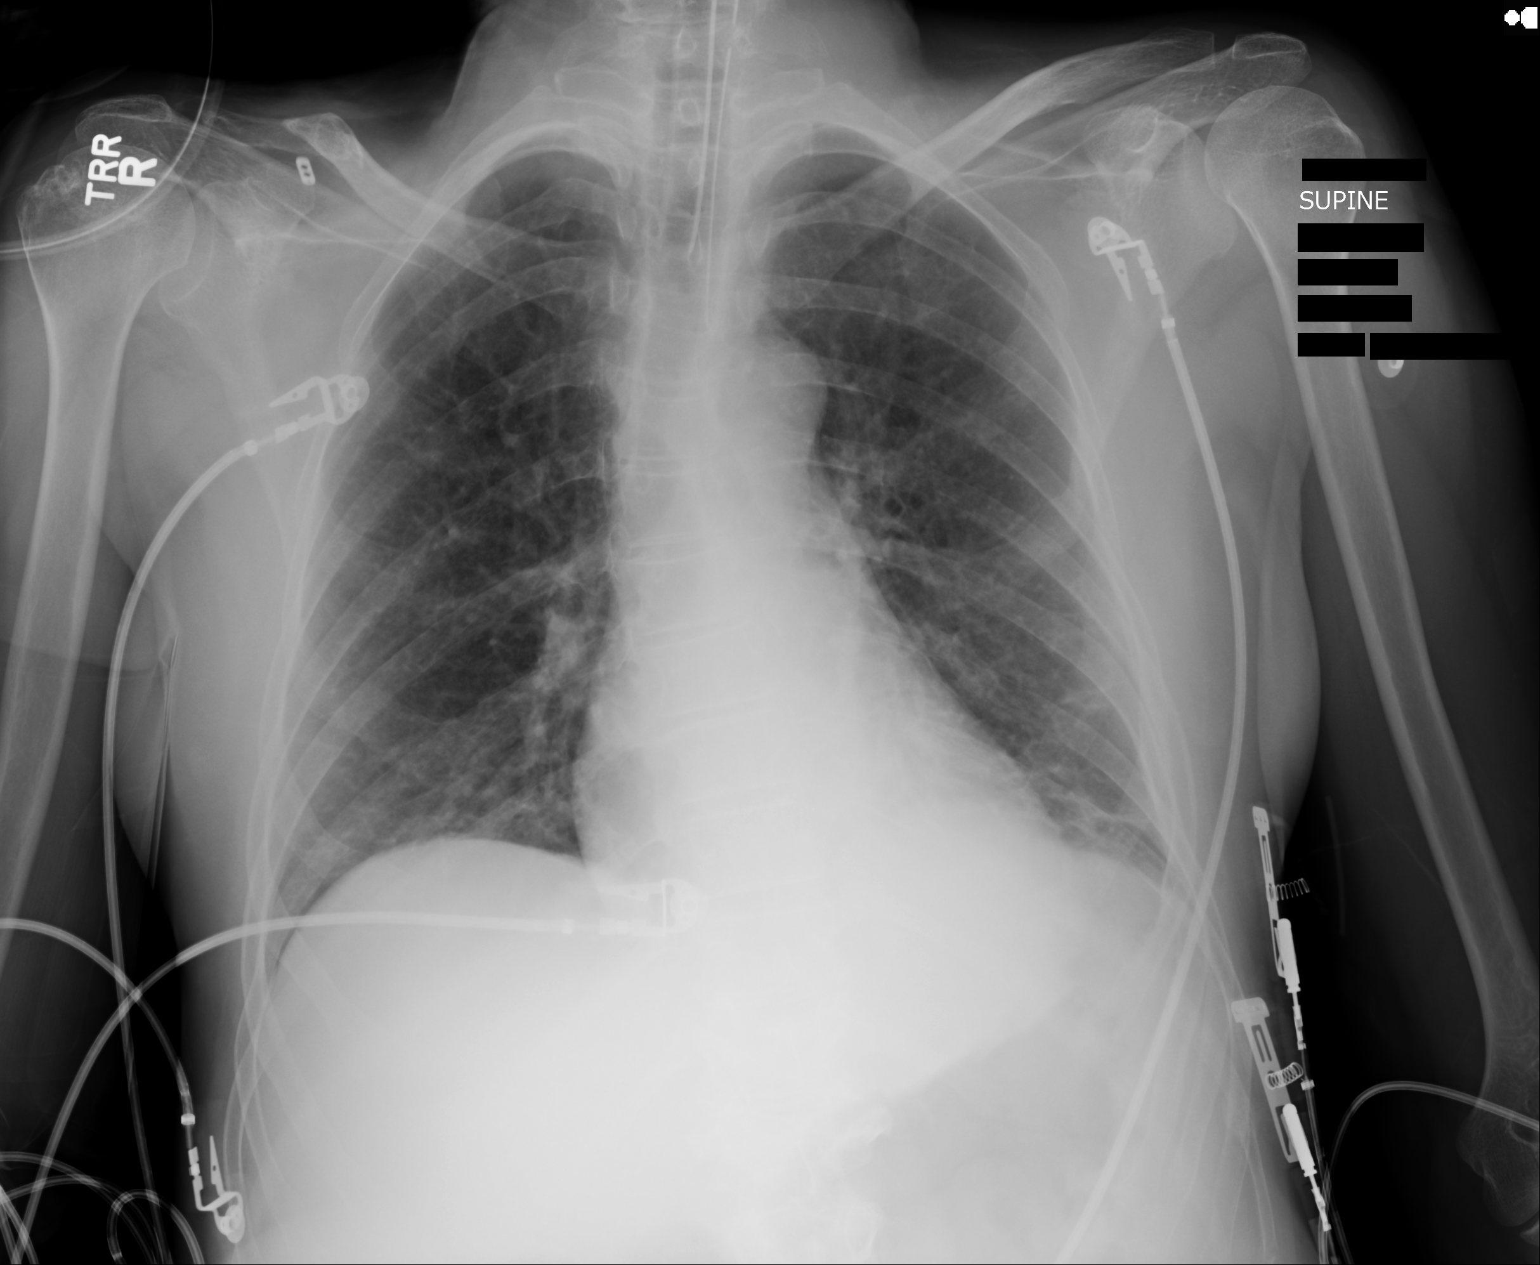

[1 of 1 positions shown; findings below may reference images not displayed]

IMPRESSION: :

1. Endotracheal tube in good position.
2. NG tube is projected over the upper thoracic esophagus.
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
## 2015-03-18 NOTE — Consult Note (Signed)
PATIENT NAME:  Latoya [name], Latoya [name] MR#:  [id number] DATE OF BIRTH:  Dec 12, 1965  DATE OF ADMISSION:  02/12/2013  DATE OF CONSULTATION:  02/17/2013  CONSULTING PHYSICIAN:  Latoya Santucci K. Nils Thor, MD  SUBJECTIVE:  Came to see the patient  in CCU 18.  Staff reports that she is still confused and not aware of her behavior.  Still has Psychologist, educationalone-on-one sitter.  However, she is more alert today than she was on 02/16/2013.    OBJECTIVE:  The patient was seen lying bed with IV Ativan and Precedex going on.  Appears calm, pleasant and cooperative, alert and she knew that she was [facility], but she did not know the name of the town, but she knew the state of [place].  [name] smile when talking about young ones at home who are 5717 and 48 years old.  Denies feeling depressed.  Denies feeling hopeless, helpless, and reports that she wants to find [name] job and go home and take care of her young ones.  Admits that she did see bugs crawling on the wall last night; that is on 02/16/2013, but not now.  The sitter, who is watching, reports that the patient has been very confused.  She is not able to focus.  Further mental status was not done.  IMPRESSION:  Alcohol dependence with intoxication and withdrawal, substance abuse, mood disorder.    RECOMMENDATIONS:  Continue Ativan on p.r.n. basis for anxiety and agitation, but this can be given oral instead of IV basis.  Continue rest of the medications after the patient is completely detoxed and she is more alert. Further planning can be discussed, as currently she refuses to go to any substance abuse treatment program, and she is eager to go home to take care of her young ones, and this has to be addressed later at the time of discharge.   ____________________________ Latoya MantisSurya K. Guss Bundehalla, MD Latoya [name]:dmm D: 02/17/2013 09:28:00 ET T: 02/17/2013 09:49:45 ET JOB#: [id number]  cc: Latoya SalkSurya K. Guss Bundehalla, MD, <Dictator> Latoya FannySURYA K Alferd Obryant MD ELECTRONICALLY SIGNED 02/21/2013 23:19
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
## 2015-03-18 NOTE — Consult Note (Signed)
PATIENT NAME:  Latoya [name], [name] MR#:  [id number] DATE OF BIRTH:  01/30/1966  DATE OF CONSULTATION:  02/16/2013  CONSULTING PHYSICIAN:  Jameika Kinn K. Rozanne Heumann, MD  SUBJECTIVE:  The patient was seen in the ICU for follow up.  The patient is still withdrawing from alcohol intoxication.  They tried to wean her off from Ativan by slowly reducing it.  She got her dose of Geodon today.    OBJECTIVE:  The patient is seen lying in bed.  She is still getting her oxygen.  She is drowsy but is arousable, but she cannot carry out any conversation.  Staff reports that she is confused when she wakes up.  Staff members who are watching 1 on 1 report that she is still hallucinating and seeing things on the walls, like bugs.  Further mental status could not be done.    IMPRESSION: Alcohol dependence with withdrawal and delirium tremens.   RECOMMENDATIONS:  Continue Geodon, and Ativan may be given p.o. on a p.r.n. basis if she gets agitated or has any signs of anxiety and withdrawal. Continue 1 on 1 observation and sitter at this time.  We will re-evaluate the patient on 02/17/2013.    ____________________________ Jannet MantisSurya K. Guss Bundehalla, MD skc:cb D: 02/16/2013 17:58:55 ET T: 02/16/2013 20:28:14 ET JOB#: [id number]  cc: Monika SalkSurya K. Guss Bundehalla, MD, <Dictator> Beau FannySURYA K Yusuf Yu MD ELECTRONICALLY SIGNED 02/17/2013 9:33
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
## 2015-03-18 NOTE — Consult Note (Signed)
  DATE OF BIRTH:  [birth year]  AGE:  49 years  SEX:  Female  RACE:  White  DATE OF CONSULTATION:  02/15/2013  PLACE OF DICTATION:  [facility], CCU #18, [place], [place]    CONSULTING PHYSICIAN:  Saveon [place] K. Natoshia Souter, MD  SUBJECTIVE: The patient was seen in consultation follow up after being seen on 02/14/2013. According to information obtained from the staff, the patient's sister came for a visit and told the staff that patient drinks, and gets drunk on a regular  basis for quite some time.  The sitter, who is observing her one-on-one, reports that patient had been combative, and she was seeing bugs crawling on the walls  yesterday, 02/14/2013, for which she was recommended to continue one-on-one. The patient was started on Ativan drip and currently she is sleepy and drowsy, and not easily arousable.   OBJECTIVE:  The patient was seen lying in bed comfortably with IV Ativan drip running. She is sleepy and not easily arousable. Further mental status could not be done at this time.   IMPRESSION: Major depressive disorder, recurrent, with probable suicide attempt, though patient denied that at time of interview on 02/14/2013. Alcohol dependence, chronic, continuous, with probable intoxication and currently going through withdrawal and delirium tremens.  THC abuse/dependence. Nicotine dependence.  RECOMMENDATION: Continue detox for her alcohol dependence and withdrawal, and delirium tremens. Continue one-on-one observation, as patient does need observation on one-on-one basis at this time. After patient is medically stable and medically cleared, then can [facility] after being seen in consultation again.     ____________________________ Jannet MantisSurya K. Guss Bundehalla, MD skc:mr D: 02/15/2013 18:34:54 ET T: 02/15/2013 18:58:49 ET JOB#: [id number]  cc: Monika SalkSurya K. Guss Bundehalla, MD, <Dictator> Beau FannySURYA K Rogina Schiano MD ELECTRONICALLY SIGNED 02/17/2013 9:30

## 2015-03-18 NOTE — Consult Note (Signed)
DATE OF BIRTH:  [birth year]  SEX:  Female   RACE:  White  AGE:  49 years  CCU #18  DATE OF CONSULTATION:  02/14/2013  CONSULTING PHYSICIAN:  Margretta Zamorano K. Guss Bunde, MD  PLACE OF DICTATION:  [facility], [place], [place]  SUBJECTIVE:  The patient was seen in consultation in CCU #18 at [facility], [place], [place]. The patient is a 49 year old white female, not on disability for breathing problems. She was never married, single, and has a boyfriend who is 27 years old, who lives with her along with her son, who is 23 years old. All 3 of them live in a trailer. The patient comes for hospitalization to the CCU after having taken 4 pills of Elavil because she wanted to sleep. She was getting increasingly drowsy, and boyfriend got worried and called for an ambulance. Then she came here.   HISTORY OF PRESENT ILLNESS:  The patient reports that she just wanted to sleep, and she did not want to die or to kill herself.  According to information obtained from the chart, it appears that it was done intentionally in front of her boyfriend. After she came to the hospital, she became increasingly unconscious and had to be intubated for about a day.   PAST PSYCHIATRIC HISTORY:  According to information obtained from the chart, the patient had previous psychiatric hospitalizations in the context of overdose on multiple medications. She has been identified with history of abuse of narcotics, benzodiazepines and alcohol. However, patient reports that she was being followed by Dr. Nedra Hai, and last saw him some time ago.  Currently she is being followed by Dr. Lacie Scotts, and next appointment is to be made soon. The patient reports that he gave Elavil to her, and she does not want to take Elavil anymore, and she wants to go back on Pristiq 50 mg p.o. daily, which has helped her in the past. In addition, she reports that she has been getting Xanax 1 mg p.o. 4 times a day. She denies any suicide attempts, but according to  the information obtained from the chart, she has intentional overdose on medications on several occasions.  SOCIAL HISTORY:  The patient reports that she drinks 3 beers once a week or twice a week, but not on an everyday basis. She does admit smoking THC with friends, and she smoked 3 puffs of THC   on the day of her admission. Denies any IV drugs. Denies crack cocaine, and denies any opioid abuse.  Does admit smoking nicotine cigarettes at the rate of a pack a day for many years.  MENTAL STATUS EXAMINATION:  The patient was seen in CCU #18, and she appears to be comfortable  in bed, with oxygen going on, and dressed in hospital clothes. Alert and oriented. Pleasant and cooperative, no agitation. Affect is appropriate with her mood, which is low and down, but she denies feeling depressed. Denies feeling hopeless or helpless. Denies any suicidal or homicidal plan, and contracts for safety and wants to get better and wants to get help, and agreed to come to [facility] when she is stabilized to get further help. Denies paranoid or suspicious ideas, but the one-to-one sitter reports that she has been seeing bugs from the TV, and patient reports that there was a bug on the wall and that she really saw it. Memory is intact. Cognition appears to be improving and better. She knew the [address] of [place], and contracts for safety and is  eager to come downstairs for help.   IMPRESSION: Axis I:  Major depressive disorder, recurrent, with probably suicide attempt, which patient denies.  Alcohol abuse-dependence. THC abuse-dependence. Nicotine dependence.   RECOMMENDATION:  [facility] when patient is medically cleared and stable, and bed is available.   ____________________________ Jannet MantisSurya K. Guss Bundehalla, MD skc:mr D: 02/14/2013 21:36:00 ET T: 02/14/2013 22:45:26 ET JOB#: [id number]  cc: Monika SalkSurya K. Guss Bundehalla, MD, <Dictator> Beau FannySURYA K Mizael Sagar MD ELECTRONICALLY SIGNED  02/15/2013 17:54
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
## 2015-03-18 NOTE — Discharge Summary (Signed)
PATIENT NAME:  Latoya [name], Latoya [name] MR#:  [id number] DATE OF BIRTH:  1966-01-09  DATE OF ADMISSION:  02/18/2013 DATE OF DISCHARGE:  02/21/2013  HOSPITAL COURSE: See dictated history and physical for details of admission. This 49 year old woman overdosed on tricyclic antidepressants and benzodiazepines and was brought to the hospital where she became delirious and required intubation. She was intubated for several days, but when she was extubated, she was seen by psychiatry. The patient has denied any suicidal ideation, although it appears clear from the documentation that she had made a suicide attempt with this overdose. Her affect was initially irritable and showing very poor insight. Because of the recent dangerousness, she was transferred to behavioral health for stabilization. The patient has been physically stable and has not shown significant symptoms of benzodiazepine withdrawal. After the first day, she has cleaned herself up and been appropriate and interactive on the unit. We have considered referring her to the [facility] and drug [facility] treatment center, although the patient strongly did not want to do that. It appeared that there would not be a bed available until sometime next week at the earliest. The patient is not currently suicidal or homicidal and agrees to outpatient treatment in the community. She is off of abusable drugs and appears to be emotionally stable. The patient was given individual and group therapy as well as medication treatment with her blood pressure medicine and p.r.n. trazodone for sleep. Also had p.r.n. Vistaril for anxiety. She is referred to the community and has an appointment at [facility]. In addition, she has significant ongoing medical problems. Her COPD was treated by continuing her inhalers. The patient has a burn on her thumb which occurred prior to admission. She continued to get dressing changes on this. It was observed by nursing to appear to possibly look infected.  Internal medicine consult saw the patient and started her on Bactrim. The Bactrim will be continued for another 10 days. Orthopedic surgery also saw the patient and concluded that she did not need intravenous antibiotics or hospital level of treatment but she should continue to get it dressed regularly and follow up with orthopedic surgery in about a week.   DISCHARGE MEDICATIONS: Bactrim DS 2 tablets q.12 hours for another 10 days, diltiazem ER 120 mg per day, Benz-O-Sthetic 20% gel for oral pain p.r.n., Domeboro powder 2 applications topically q.24 hours with the dressing on her thumb, Spiriva HandiHaler 1 capsule daily inhaled, Robaxin 750 mg q.6 hours p.r.n. for pain, Vistaril 50 mg p.o. q.6 hours p.r.n. for anxiety.   LABORATORY RESULTS: The patient had a culture of her thumb wound which is now being read out as heavy growth of methicillin-resistant Staphylococcus aureus. TSH 0.7. Creatinine low at 0.58. No elevation in t white count. CBC otherwise normal.   MENTAL STATUS EXAM AT DISCHARGE: Casually dressed, neatly groomed woman, looks her stated age. Good eye contact. Normal psychomotor activity. Speech normal in rate, tone and volume. Affect euthymic, reactive and appropriate. Mood stated as being okay. Thoughts are lucid. No evidence of loosening of associations. Denies suicidal or homicidal ideation. Denies any hallucinations or delusions. The patient can state multiple positive things in her life that she has to live for. She is agreeable to outpatient treatment and shows improved insight and judgment.   DIAGNOSIS, PRINCIPAL AND PRIMARY:  AXIS I: Benzodiazepine dependence.   SECONDARY DIAGNOSES:  AXIS I: Depression, not otherwise specified.  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  AXIS III: Hypertension, acute  wound to her thumb, oral pain, chronic obstructive pulmonary disease.  AXIS IV: Moderate to severe, chronic stress from lack of resources.  AXIS  V: Functioning at time of discharge 55.     ____________________________ Audery AmelJohn T. Wilma Michaelson, MD jtc:gb D: 02/23/2013 00:09:18 ET T: 02/23/2013 01:09:38 ET JOB#: [id number]  cc: Audery AmelJohn T. Ludell Zacarias, MD, <Dictator> Audery AmelJOHN T Pauline Trainer MD ELECTRONICALLY SIGNED 02/24/2013 0:22
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
